# Patient Record
Sex: Male | Born: 2000 | Race: Black or African American | Hispanic: No | Marital: Single | Smoking: Current every day smoker
Health system: Southern US, Community
[De-identification: ages and names within clinical notes are randomized; demographics above are authoritative.]

---
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
## 2020-01-01 ENCOUNTER — Other Ambulatory Visit: Payer: Self-pay

## 2020-01-01 ENCOUNTER — Encounter (HOSPITAL_COMMUNITY): Payer: Self-pay

## 2020-01-01 ENCOUNTER — Ambulatory Visit (HOSPITAL_COMMUNITY)
Admission: EM | Admit: 2020-01-01 | Discharge: 2020-01-01 | Disposition: A | Discharge status: Home / Self Care | Payer: Medicaid Other | Attending: Emergency Medicine | Admitting: Emergency Medicine

## 2020-01-01 DIAGNOSIS — M545 Low back pain, unspecified: Secondary | ICD-10-CM

## 2020-01-01 DIAGNOSIS — M546 Pain in thoracic spine: Secondary | ICD-10-CM

## 2020-01-01 MED ORDER — NAPROXEN 500 MG PO TABS: 500.0000 mg | ORAL_TABLET | Freq: Two times a day (BID) | ORAL | 0 refills | Status: DC

## 2020-01-01 MED ORDER — CYCLOBENZAPRINE HCL 5 MG PO TABS: 5.0000 mg | ORAL_TABLET | Freq: Two times a day (BID) | ORAL | 0 refills | Status: DC | PRN

## 2020-01-01 NOTE — ED Triage Notes (Signed)
Pt presents with bilateral shoulder, bilateral thigh and back pain X 2 weeks not injury related.

## 2020-01-01 NOTE — Discharge Instructions (Signed)
Please use Naprosyn twice daily with food over the next 10 days You may use flexeril as needed to help with pain. This is a muscle relaxer and causes sedation- please use only at bedtime or when you will be home and not have to drive/work Gentle stretching Alternate ice and heat  Follow-up if not improving or worsening

## 2020-01-02 NOTE — ED Provider Notes (Signed)
[facility]    CSN: [id number] Arrival date & time: 01/01/20  1409      History   Chief Complaint Chief Complaint  Patient presents with  . Shoulder Pain  . Back Pain  . Thigh Pain    HPI Alex Gonzales is a 19 y.o. male history of scoliosis presenting today for evaluation of shoulder back and thigh pain.  Patient reports over the past 2 weeks he has had pain in his shoulders and lower back.  Feels soreness in his thighs as well.  Has been using some ibuprofen without relief.  Denies any specific injury or trauma.  Denies significant heavy lifting or repetitive bending.  Denies paresthesias.  Denies issues with bowel/bladder.  Denies chest pain or shortness of breath.  HPI  History reviewed. No pertinent past medical history.  Patient Active Problem List   Diagnosis Date Noted  . Scoliosis 12/08/2015    History reviewed. No pertinent surgical history.     Home Medications    Prior to Admission medications   Medication Sig Start Date End Date Taking? Authorizing Provider  albuterol (PROAIR HFA) 108 (90 Base) MCG/ACT inhaler 2 puffs every 4-6 hours as needed for wheezing/coughing 12/28/19   Lucio Edward, MD  benzonatate (TESSALON) 100 MG capsule Take 1-2 capsules (100-200 mg total) by mouth 3 (three) times daily as needed. 12/01/19   Wallis Bamberg, PA-C  cetirizine (ZYRTEC) 10 MG tablet 1 tab p.o. nightly as needed allergies. 12/28/19   Lucio Edward, MD  cyclobenzaprine (FLEXERIL) 5 MG tablet Take 1-2 tablets (5-10 mg total) by mouth 2 (two) times daily as needed for muscle spasms. 01/01/20   Rafal Archuleta C, PA-C  fluticasone (FLONASE) 50 MCG/ACT nasal spray 1 spray each nostril once a day as needed congestion. 12/28/19   Lucio Edward, MD  naproxen (NAPROSYN) 500 MG tablet Take 1 tablet (500 mg total) by mouth 2 (two) times daily. 01/01/20   Ludmilla Mcgillis, Junius Creamer, PA-C  Spacer/Aero-Holding Chambers (AEROCHAMBER MV) inhaler Use as instructed 12/28/19   Lucio Edward, MD    Family History Family History  Problem Relation Age of Onset  . Asthma Sister   . Asthma Brother     Social History Social History   Tobacco Use  . Smoking status: Never Smoker  . Smokeless tobacco: Never Used  Vaping Use  . Vaping Use: Every day  . Substances: Nicotine  Substance Use Topics  . Alcohol use: No  . Drug use: Yes    Types: Marijuana     Allergies   Patient has no known allergies.   Review of Systems Review of Systems  Constitutional: Negative for fatigue and fever.  Eyes: Negative for redness, itching and visual disturbance.  Respiratory: Negative for shortness of breath.   Cardiovascular: Negative for chest pain and leg swelling.  Gastrointestinal: Negative for nausea and vomiting.  Musculoskeletal: Positive for arthralgias, back pain and myalgias.  Skin: Negative for color change, rash and wound.  Neurological: Negative for dizziness, syncope, weakness, light-headedness and headaches.     Physical Exam Triage Vital Signs ED Triage Vitals  Enc Vitals Group     BP 01/01/20 1427 116/62     Pulse Rate 01/01/20 1427 71     Resp 01/01/20 1427 18     Temp 01/01/20 1427 99 F (37.2 C)     Temp Source 01/01/20 1427 Oral     SpO2 01/01/20 1427 100 %     Weight --      Height --  Head Circumference --      Peak Flow --      Pain Score 01/01/20 1429 7     Pain Loc --      Pain Edu? --      Excl. in [place]? --    No data found.  Updated Vital Signs BP 116/62 (BP Location: Right Arm)   Pulse 71   Temp 99 F (37.2 C) (Oral)   Resp 18   SpO2 100%   Visual Acuity Right Eye Distance:   Left Eye Distance:   Bilateral Distance:    Right Eye Near:   Left Eye Near:    Bilateral Near:     Physical Exam Vitals and nursing note reviewed.  Constitutional:      Appearance: He is well-developed.     Comments: No acute distress  HENT:     Head: Normocephalic and atraumatic.     Nose: Nose normal.  Eyes:      Conjunctiva/sclera: Conjunctivae normal.  Cardiovascular:     Rate and Rhythm: Normal rate.  Pulmonary:     Effort: Pulmonary effort is normal. No respiratory distress.  Abdominal:     General: There is no distension.  Musculoskeletal:        General: Normal range of motion.     Cervical back: Neck supple.     Comments: Tender to palpation diffusely to bilateral trapezius and thoracic areas of back extending into bilateral lumbar areas, tenderness to palpation over anterior thighs  Full active range of motion at shoulders hips and knees  Ambulating without abnormality  Skin:    General: Skin is warm and dry.  Neurological:     Mental Status: He is alert and oriented to person, place, and time.      UC Treatments / Results  Labs (all labs ordered are listed, but only abnormal results are displayed) Labs Reviewed - No data to display  EKG   Radiology No results found.  Procedures Procedures (including critical care time)  Medications Ordered in UC Medications - No data to display  Initial Impression / Assessment and Plan / UC Course  I have reviewed the triage vital signs and the nursing notes.  Pertinent labs & imaging results that were available during my care of the patient were reviewed by me and considered in my medical decision making (see chart for details).     Suspect likely muscular etiology, no mechanism of injury, moving all extremities appropriately, do not suspect acute bony abnormality.  Recommending anti-inflammatories and muscle relaxers with gentle stretching.  Provided Naprosyn as alternative to ibuprofen. Discussed strict return precautions. Patient verbalized understanding and is agreeable with plan.   Final Clinical Impressions(s) / UC Diagnoses   Final diagnoses:  Acute bilateral low back pain without sciatica  Acute bilateral thoracic back pain     Discharge Instructions     Please use Naprosyn twice daily with food over the next 10  days You may use flexeril as needed to help with pain. This is a muscle relaxer and causes sedation- please use only at bedtime or when you will be home and not have to drive/work Gentle stretching Alternate ice and heat  Follow-up if not improving or worsening   ED Prescriptions    Medication Sig Dispense Auth. Provider   naproxen (NAPROSYN) 500 MG tablet Take 1 tablet (500 mg total) by mouth 2 (two) times daily. 30 tablet Grant Henkes C, PA-C   cyclobenzaprine (FLEXERIL) 5 MG tablet Take 1-2 tablets (  5-10 mg total) by mouth 2 (two) times daily as needed for muscle spasms. 24 tablet Gonzales Dacosta, [place] C, PA-C     PDMP not reviewed this encounter.   Lew Dawes, [place] 01/02/20 1342

## 2020-01-03 ENCOUNTER — Encounter: Payer: Self-pay | Admitting: Pediatrics

## 2020-01-03 ENCOUNTER — Ambulatory Visit (INDEPENDENT_AMBULATORY_CARE_PROVIDER_SITE_OTHER): Payer: Medicaid Other | Admitting: Pediatrics

## 2020-01-03 ENCOUNTER — Other Ambulatory Visit: Payer: Self-pay

## 2020-01-03 VITALS — Temp 97.7°F | Wt 122.0 lb

## 2020-01-03 DIAGNOSIS — J020 Streptococcal pharyngitis: Secondary | ICD-10-CM

## 2020-01-03 DIAGNOSIS — G8929 Other chronic pain: Secondary | ICD-10-CM

## 2020-01-03 DIAGNOSIS — J40 Bronchitis, not specified as acute or chronic: Secondary | ICD-10-CM | POA: Diagnosis not present

## 2020-01-03 DIAGNOSIS — M549 Dorsalgia, unspecified: Secondary | ICD-10-CM

## 2020-01-03 DIAGNOSIS — J0100 Acute maxillary sinusitis, unspecified: Secondary | ICD-10-CM

## 2020-01-03 MED ORDER — AMOXICILLIN-POT CLAVULANATE 500-125 MG PO TABS: ORAL_TABLET | ORAL | 0 refills | Status: DC

## 2020-01-03 MED ORDER — PREDNISONE 20 MG PO TABS: ORAL_TABLET | ORAL | 0 refills | Status: DC

## 2020-01-03 NOTE — Patient Instructions (Signed)

## 2020-01-03 NOTE — Progress Notes (Signed)
Subjective:     Patient ID: Alex Gonzales, male   DOB: 07/15/2000, 19 y.o.   MRN: [id number]  Chief Complaint  Patient presents with  . Follow-up    HPI: Patient is here for follow-up visit from last week.  He was diagnosed with pharyngitis as well as bronchitis.  According to the patient, he has been taking his Zithromax.  He states he has been feeling some better, however continues to complain of back pain.  He states he did go to the urgent care due to the back pain and was diagnosed again with muscle spasms and given medications.  He states since he could not go to work, he had to have a doctor's excuse.  Alex Gonzales states that he has used his albuterol.  He states it helps him some.  He states that the last time he took his albuterol inhaler was yesterday.  However, upon further questioning, Alex Gonzales continues to use e-cigarettes.  He states the last time he smoked was yesterday.  Discussed with Alex Gonzales that his strep test was positive.  He states that he continues to have some sore throat as well as postnasal drainage.  He states he has to clear his throat in order to be able to talk.  In regards to back pain, patient states that his upper back as well as lower back has been hurting.  He states sometimes the pain also is anterior thigh as well.  He states that he feels this is secondary to the constant standing that he does at his work.  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  Upon further questioning, he states that sometimes the pain does radiate to the back of the legs as well.  He denies any numbness or tingling.  Noted that Alex Gonzales looked tired today, therefore asked him when did he go to bed last night.  He states at 2:00 in the morning.  I asked him why was it that he did not go to sleep until late, he states he was watching Netflix and got interested in the programs he was watching.  No past  medical history on file.   Family History  Problem Relation Age of Onset  . Asthma Sister   . Asthma Brother     Social History   Tobacco Use  . Smoking status: Current Every Day Smoker    Types: E-cigarettes  . Smokeless tobacco: Never Used  Substance Use Topics  . Alcohol use: No   Social History   Social History Narrative   Lives at home with mother, father and siblings.   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    Outpatient Encounter Medications as of 01/03/2020  Medication Sig  . albuterol (PROAIR HFA) 108 (90 Base) MCG/ACT inhaler 2 puffs every 4-6 hours as needed for wheezing/coughing  . amoxicillin-clavulanate (AUGMENTIN) 500-125 MG tablet 1 tab p.o. twice daily x10 days.  . benzonatate (TESSALON) 100 MG capsule Take 1-2 capsules (100-200 mg total) by mouth 3 (three) times daily as needed.  . cetirizine (ZYRTEC) 10 MG tablet 1 tab p.o. nightly as needed allergies.  . cyclobenzaprine (FLEXERIL) 5 MG tablet Take 1-2 tablets (5-10 mg total) by mouth 2 (two) times daily as needed for muscle spasms.  . fluticasone (FLONASE) 50 MCG/ACT nasal spray 1 spray each nostril once a day as needed congestion.  Marland Kitchen  naproxen (NAPROSYN) 500 MG tablet Take 1 tablet (500 mg total) by mouth 2 (two) times daily.  . predniSONE (DELTASONE) 20 MG tablet 2 tabs p.o. daily x3 days.  Marland Kitchen Spacer/Aero-Holding Chambers (AEROCHAMBER MV) inhaler Use as instructed   No facility-administered encounter medications on file as of 01/03/2020.    Patient has no known allergies.    ROS:  Apart from the symptoms reviewed above, there are no other symptoms referable to all systems reviewed.   Physical Examination   Wt Readings from Last 3 Encounters:  01/03/20 122 lb (55.3 kg) (6 %, Z= -1.58)*  12/28/19 119 lb 12.8 oz (54.3 kg) (4 %, Z= -1.72)*  12/01/19 120 lb (54.4 kg) (5 %, Z= -1.69)*   * Growth percentiles are based on CDC (Boys, 2-20 Years) data.   BP Readings from  Last 3 Encounters:  01/01/20 116/62  12/01/19 127/73  01/30/18 (!) 120/60 (61 %, Z = 0.28 /  23 %, Z = -0.75)*   *BP percentiles are based on the 2017 AAP Clinical Practice Guideline for boys   Body mass index is 18.55 kg/m. 4 %ile (Z= -1.76) based on CDC (Boys, 2-20 Years) BMI-for-age data using weight from 01/03/2020 and height from 12/01/2019. Blood pressure percentiles are not available for patients who are 18 years or older.    General: Alert, NAD,  HEENT: TM's - clear, Throat -improved size of tonsils, however thick postnasal drainage present, Neck - FROM, no meningismus, Sclera - clear LYMPH NODES: No lymphadenopathy noted LUNGS: Clear to auscultation bilaterally,  no wheezing or crackles noted, continued rhonchi with cough CV: RRR without Murmurs ABD: Soft, NT, positive bowel signs,  No hepatosplenomegaly noted GU: Not examined SKIN: Clear, No rashes noted NEUROLOGICAL: Grossly intact MUSCULOSKELETAL: Muscular spasm noted over the right paraspinous muscles, also complains of radiation of pain to the back of the legs.  Tighter hamstrings noted. Psychiatric: Affect normal, non-anxious   Rapid Strep A Screen  Date Value Ref Range Status  12/28/2019 Negative Negative Final     No results found.  Recent Results (from the past 240 hour(s))  Culture, Group A Strep     Status: Abnormal   Collection Time: 12/28/19 11:12 AM   Specimen: Throat  Result Value Ref Range Status   MICRO NUMBER: [id number]  Final   SPECIMEN QUALITY: Adequate  Final   SOURCE: THROAT  Final   STATUS: FINAL  Final   ISOLATE 1: Streptococcus pyogenes (A)  Final    Comment: Group A Streptococcus isolated Beta-hemolytic streptococci are predictably susceptible to Penicillin and other beta-lactams. Susceptibility testing not routinely performed. Please contact the laboratory within 3 days if susceptibility testing is desired.    No results found for this or any previous visit (from the past 48  hour(s)).  Assessment:  1. Acute non-recurrent maxillary sinusitis  2. Strep pharyngitis  3. Other chronic back pain  4. Bronchitis     Plan:   1.  Patient noted to have thick postnasal drainage in the office today.  Also secondary to diagnosis of strep pyogenes infection which is susceptible to penicillins, decided place him on Augmentin 500 mg, 1 tab p.o. twice daily x10 days.  He does have 1 last dosage of Zithromax left for today as well. 2.  Secondary to continued rhonchi noted with the coughing, will place him on prednisone 20 mg, 2 tabs p.o. daily x3 days.  However again discussed at length with Amada Kingfisher that the vaping is exacerbating his bronchitis.  Given that  he states that he was not "hooked" on the vaping, asked him why he has not stopped using this method.  He promises that he will. 3.  Secondary to continuation of back pain and now patient complains of radiation of pain to the back of the legs and has some tight hamstrings that are noted in the examination today, we will have him referred to orthopedics for further evaluation. 4.  Spent 25 minutes with the patient face-to-face of which over 50% was in counseling in regards to evaluation and treatment of sinusitis, bronchitis, vaping as well as back pain. Meds ordered this encounter  Medications  . amoxicillin-clavulanate (AUGMENTIN) 500-125 MG tablet    Sig: 1 tab p.o. twice daily x10 days.    Dispense:  20 tablet    Refill:  0  . predniSONE (DELTASONE) 20 MG tablet    Sig: 2 tabs p.o. daily x3 days.    Dispense:  6 tablet    Refill:  0

## 2020-01-04 ENCOUNTER — Ambulatory Visit: Payer: Self-pay | Admitting: Pediatrics

## 2020-01-06 DIAGNOSIS — Z419 Encounter for procedure for purposes other than remedying health state, unspecified: Secondary | ICD-10-CM | POA: Diagnosis not present

## 2020-01-12 ENCOUNTER — Encounter (HOSPITAL_COMMUNITY): Payer: Self-pay | Admitting: Emergency Medicine

## 2020-01-12 ENCOUNTER — Ambulatory Visit (HOSPITAL_COMMUNITY)
Admission: EM | Admit: 2020-01-12 | Discharge: 2020-01-12 | Disposition: A | Discharge status: Home / Self Care | Payer: Medicaid Other

## 2020-01-12 ENCOUNTER — Other Ambulatory Visit: Payer: Self-pay

## 2020-01-12 DIAGNOSIS — K529 Noninfective gastroenteritis and colitis, unspecified: Secondary | ICD-10-CM | POA: Diagnosis not present

## 2020-01-12 NOTE — Discharge Instructions (Signed)
This is most likely some sort of virus. I am glad you are feeling better. Make sure you drink plenty of fluids and staying hydrated. Bland diet and advance diet as tolerated Follow up as needed for continued or worsening symptoms

## 2020-01-12 NOTE — ED Triage Notes (Signed)
Pt sts vomiting x 2 and diarrhea yesterday that has resolved; pt sts still some GI upset but able to eat and drink today

## 2020-01-12 NOTE — ED Provider Notes (Signed)
[facility]    CSN: [id number] Arrival date & time: 01/12/20  1342      History   Chief Complaint Chief Complaint  Patient presents with  . Emesis    HPI Alex Gonzales is a 19 y.o. male.   Patient is an otherwise healthy 19 year old male presents today with nausea, vomiting and diarrhea that started yesterday evening.  Reporting multiple episodes of vomiting and diarrhea.  Symptoms have since subsided since 3 AM.  Mild abdominal cramping and nausea.  Tried to eat but did not have any appetite.  No fevers, chills, body aches or recent sick contacts.  No respiratory symptoms.  ROS per HPI      History reviewed. No pertinent past medical history.  Patient Active Problem List   Diagnosis Date Noted  . Scoliosis 12/08/2015    History reviewed. No pertinent surgical history.     Home Medications    Prior to Admission medications   Medication Sig Start Date End Date Taking? Authorizing Provider  albuterol (PROAIR HFA) 108 (90 Base) MCG/ACT inhaler 2 puffs every 4-6 hours as needed for wheezing/coughing 12/28/19   Lucio Edward, MD  cetirizine (ZYRTEC) 10 MG tablet 1 tab p.o. nightly as needed allergies. 12/28/19   Lucio Edward, MD  fluticasone (FLONASE) 50 MCG/ACT nasal spray 1 spray each nostril once a day as needed congestion. 12/28/19   Lucio Edward, MD  naproxen (NAPROSYN) 500 MG tablet Take 1 tablet (500 mg total) by mouth 2 (two) times daily. 01/01/20   Wieters, Junius Creamer, PA-C  Spacer/Aero-Holding Chambers (AEROCHAMBER MV) inhaler Use as instructed 12/28/19   Lucio Edward, MD    Family History Family History  Problem Relation Age of Onset  . Asthma Sister   . Asthma Brother     Social History Social History   Tobacco Use  . Smoking status: Current Every Day Smoker    Types: E-cigarettes  . Smokeless tobacco: Never Used  Vaping Use  . Vaping Use: Every day  . Substances: Nicotine  Substance Use Topics  . Alcohol use: No  . Drug  use: Yes    Types: Marijuana     Allergies   Patient has no known allergies.   Review of Systems Review of Systems   Physical Exam Triage Vital Signs ED Triage Vitals  Enc Vitals Group     BP 01/12/20 1513 109/83     Pulse Rate 01/12/20 1513 66     Resp 01/12/20 1513 18     Temp 01/12/20 1513 98.8 F (37.1 C)     Temp Source 01/12/20 1513 Oral     SpO2 01/12/20 1513 100 %     Weight --      Height --      Head Circumference --      Peak Flow --      Pain Score 01/12/20 1514 0     Pain Loc --      Pain Edu? --      Excl. in GC? --    No data found.  Updated Vital Signs BP 109/83 (BP Location: Right Arm)   Pulse 66   Temp 98.8 F (37.1 C) (Oral)   Resp 18   SpO2 100%   Visual Acuity Right Eye Distance:   Left Eye Distance:   Bilateral Distance:    Right Eye Near:   Left Eye Near:    Bilateral Near:     Physical Exam Vitals and nursing note reviewed.  Constitutional:  Appearance: Normal appearance.  HENT:     Head: Normocephalic and atraumatic.     Nose: Nose normal.  Eyes:     Conjunctiva/sclera: Conjunctivae normal.  Cardiovascular:     Rate and Rhythm: Normal rate and regular rhythm.  Pulmonary:     Effort: Pulmonary effort is normal.     Breath sounds: Normal breath sounds.  Abdominal:     Palpations: Abdomen is soft.     Tenderness: There is abdominal tenderness.     Comments: Mildly tender to upper abdominal area  Musculoskeletal:        General: Normal range of motion.     Cervical back: Normal range of motion.  Skin:    General: Skin is warm and dry.  Neurological:     Mental Status: He is alert.  Psychiatric:        Mood and Affect: Mood normal.      UC Treatments / Results  Labs (all labs ordered are listed, but only abnormal results are displayed) Labs Reviewed - No data to display  EKG   Radiology No results found.  Procedures Procedures (including critical care time)  Medications Ordered in UC Medications  - No data to display  Initial Impression / Assessment and Plan / UC Course  I have reviewed the triage vital signs and the nursing notes.  Pertinent labs & imaging results that were available during my care of the patient were reviewed by me and considered in my medical decision making (see chart for details).     Gastroenteritis Recommended drink fluids and stay hydrated.  Bland diet and advance diet as tolerated. Follow up as needed for continued or worsening symptoms  Final Clinical Impressions(s) / UC Diagnoses   Final diagnoses:  Gastroenteritis     Discharge Instructions     This is most likely some sort of virus. I am glad you are feeling better. Make sure you drink plenty of fluids and staying hydrated. Bland diet and advance diet as tolerated Follow up as needed for continued or worsening symptoms     ED Prescriptions    None     PDMP not reviewed this encounter.   Dahlia Byes A, NP 01/12/20 1548

## 2020-01-26 ENCOUNTER — Ambulatory Visit: Payer: Medicaid Other

## 2020-01-28 ENCOUNTER — Other Ambulatory Visit: Payer: Self-pay | Admitting: Pediatrics

## 2020-01-28 DIAGNOSIS — J40 Bronchitis, not specified as acute or chronic: Secondary | ICD-10-CM

## 2020-02-01 ENCOUNTER — Other Ambulatory Visit: Payer: Self-pay | Admitting: Pediatrics

## 2020-02-01 ENCOUNTER — Other Ambulatory Visit: Payer: Self-pay

## 2020-02-01 DIAGNOSIS — J40 Bronchitis, not specified as acute or chronic: Secondary | ICD-10-CM

## 2020-02-04 ENCOUNTER — Ambulatory Visit: Payer: Medicaid Other

## 2020-02-06 DIAGNOSIS — Z419 Encounter for procedure for purposes other than remedying health state, unspecified: Secondary | ICD-10-CM | POA: Diagnosis not present

## 2020-02-16 ENCOUNTER — Ambulatory Visit: Payer: Medicaid Other

## 2020-03-08 DIAGNOSIS — Z419 Encounter for procedure for purposes other than remedying health state, unspecified: Secondary | ICD-10-CM | POA: Diagnosis not present

## 2020-03-20 DIAGNOSIS — Z20822 Contact with and (suspected) exposure to covid-19: Secondary | ICD-10-CM | POA: Diagnosis not present

## 2020-04-07 DIAGNOSIS — Z419 Encounter for procedure for purposes other than remedying health state, unspecified: Secondary | ICD-10-CM | POA: Diagnosis not present

## 2020-04-12 ENCOUNTER — Ambulatory Visit: Payer: Medicaid Other

## 2020-05-01 ENCOUNTER — Other Ambulatory Visit: Payer: Self-pay | Admitting: Pediatrics

## 2020-05-01 ENCOUNTER — Ambulatory Visit: Payer: Medicaid Other | Admitting: Pediatrics

## 2020-05-01 DIAGNOSIS — J40 Bronchitis, not specified as acute or chronic: Secondary | ICD-10-CM

## 2020-05-01 MED ORDER — ALBUTEROL SULFATE HFA 108 (90 BASE) MCG/ACT IN AERS: INHALATION_SPRAY | RESPIRATORY_TRACT | 0 refills | Status: DC

## 2020-05-08 DIAGNOSIS — Z419 Encounter for procedure for purposes other than remedying health state, unspecified: Secondary | ICD-10-CM | POA: Diagnosis not present

## 2020-05-10 ENCOUNTER — Ambulatory Visit: Payer: Medicaid Other | Admitting: Pediatrics

## 2020-06-07 DIAGNOSIS — Z419 Encounter for procedure for purposes other than remedying health state, unspecified: Secondary | ICD-10-CM | POA: Diagnosis not present

## 2020-07-08 DIAGNOSIS — Z419 Encounter for procedure for purposes other than remedying health state, unspecified: Secondary | ICD-10-CM | POA: Diagnosis not present

## 2020-08-08 DIAGNOSIS — Z419 Encounter for procedure for purposes other than remedying health state, unspecified: Secondary | ICD-10-CM | POA: Diagnosis not present

## 2020-08-11 DIAGNOSIS — K130 Diseases of lips: Secondary | ICD-10-CM | POA: Diagnosis not present

## 2020-08-11 DIAGNOSIS — J3489 Other specified disorders of nose and nasal sinuses: Secondary | ICD-10-CM | POA: Diagnosis not present

## 2020-08-22 DIAGNOSIS — G4700 Insomnia, unspecified: Secondary | ICD-10-CM | POA: Diagnosis not present

## 2020-08-22 DIAGNOSIS — R0989 Other specified symptoms and signs involving the circulatory and respiratory systems: Secondary | ICD-10-CM | POA: Diagnosis not present

## 2020-08-22 DIAGNOSIS — R748 Abnormal levels of other serum enzymes: Secondary | ICD-10-CM | POA: Diagnosis not present

## 2020-09-13 ENCOUNTER — Other Ambulatory Visit: Payer: Self-pay | Admitting: Pediatrics

## 2020-09-13 DIAGNOSIS — F411 Generalized anxiety disorder: Secondary | ICD-10-CM | POA: Diagnosis not present

## 2020-09-13 DIAGNOSIS — J40 Bronchitis, not specified as acute or chronic: Secondary | ICD-10-CM

## 2020-09-13 DIAGNOSIS — Z79899 Other long term (current) drug therapy: Secondary | ICD-10-CM | POA: Diagnosis not present

## 2020-09-13 DIAGNOSIS — F331 Major depressive disorder, recurrent, moderate: Secondary | ICD-10-CM | POA: Diagnosis not present

## 2020-09-13 DIAGNOSIS — G4700 Insomnia, unspecified: Secondary | ICD-10-CM | POA: Diagnosis not present

## 2020-09-13 MED ORDER — ALBUTEROL SULFATE HFA 108 (90 BASE) MCG/ACT IN AERS: INHALATION_SPRAY | RESPIRATORY_TRACT | 0 refills | Status: DC

## 2020-09-28 ENCOUNTER — Ambulatory Visit (HOSPITAL_COMMUNITY)
Admission: EM | Admit: 2020-09-28 | Discharge: 2020-09-28 | Disposition: A | Discharge status: Home / Self Care | Payer: Medicaid Other | Attending: Internal Medicine | Admitting: Internal Medicine

## 2020-09-28 ENCOUNTER — Other Ambulatory Visit: Payer: Self-pay

## 2020-09-28 ENCOUNTER — Encounter (HOSPITAL_COMMUNITY): Payer: Self-pay

## 2020-09-28 DIAGNOSIS — S63502A Unspecified sprain of left wrist, initial encounter: Secondary | ICD-10-CM

## 2020-09-28 MED ORDER — IBUPROFEN 400 MG PO TABS
400.0000 mg | ORAL_TABLET | Freq: Four times a day (QID) | ORAL | 0 refills | Status: DC | PRN
Start: 2020-09-28 — End: 2024-02-19

## 2020-09-28 NOTE — ED Triage Notes (Signed)
Pt presents with left wrist pain X 3 days; non injury related.

## 2020-09-28 NOTE — ED Provider Notes (Signed)
[facility]    CSN: [id number] Arrival date & time: 09/28/20  1154      History   Chief Complaint Chief Complaint  Patient presents with  . Wrist Pain    HPI Alex Gonzales is a 20 y.o. male with no past medical history comes to urgent care with left wrist pain of 3 days duration.  Patient did some heavy weightlifting in the gym prior to the onset of the left wrist pain.  No significant trauma noted.  Patient describes the pain as sharp, aggravated by movement and relieved by keeping hand still.  Patient has full range of motion around the left wrist.  Pain is mild to moderate.  No swelling of the wrist.  No bruising.  No numbness or tingling of the left wrist or fingers.Marland Kitchen   HPI  History reviewed. No pertinent past medical history.  Patient Active Problem List   Diagnosis Date Noted  . Scoliosis 12/08/2015    History reviewed. No pertinent surgical history.     Home Medications    Prior to Admission medications   Medication Sig Start Date End Date Taking? Authorizing Provider  ibuprofen (ADVIL) 400 MG tablet Take 1 tablet (400 mg total) by mouth every 6 (six) hours as needed. 09/28/20  Yes Merrilee Jansky, MD  albuterol [facility] HFA) 108 (90 Base) MCG/ACT inhaler 2 PUFFS EVERY 4-6 HOURS AS NEEDED FOR WHEEZING/COUGHING 09/13/20   Richrd Sox, MD  cetirizine (ZYRTEC) 10 MG tablet 1 tab p.o. nightly as needed allergies. 12/28/19   Lucio Edward, MD  fluticasone (FLONASE) 50 MCG/ACT nasal spray 1 spray each nostril once a day as needed congestion. 12/28/19   Lucio Edward, MD  naproxen (NAPROSYN) 500 MG tablet Take 1 tablet (500 mg total) by mouth 2 (two) times daily. 01/01/20   Wieters, Junius Creamer, PA-C  Spacer/Aero-Holding Chambers (AEROCHAMBER MV) inhaler Use as instructed 12/28/19   Lucio Edward, MD    Family History Family History  Problem Relation Age of Onset  . Asthma Sister   . Asthma Brother     Social History Social History   Tobacco Use   . Smoking status: Current Every Day Smoker    Types: E-cigarettes  . Smokeless tobacco: Never Used  Vaping Use  . Vaping Use: Every day  . Substances: Nicotine  Substance Use Topics  . Alcohol use: No  . Drug use: Yes    Types: Marijuana     Allergies   Patient has no known allergies.   Review of Systems Review of Systems  Constitutional: Negative.   Eyes: Negative.   Gastrointestinal: Negative.   Musculoskeletal: Positive for arthralgias. Negative for myalgias.  Neurological: Negative.      Physical Exam Triage Vital Signs ED Triage Vitals  Enc Vitals Group     BP 09/28/20 1251 (!) 123/57     Pulse Rate 09/28/20 1251 75     Resp 09/28/20 1251 17     Temp 09/28/20 1251 97.7 F (36.5 C)     Temp Source 09/28/20 1251 Oral     SpO2 09/28/20 1251 100 %     Weight --      Height --      Head Circumference --      Peak Flow --      Pain Score 09/28/20 1252 6     Pain Loc --      Pain Edu? --      Excl. in GC? --    No data  found.  Updated Vital Signs BP (!) 123/57 (BP Location: Right Arm)   Pulse 75   Temp 97.7 F (36.5 C) (Oral)   Resp 17   SpO2 100%   Visual Acuity Right Eye Distance:   Left Eye Distance:   Bilateral Distance:    Right Eye Near:   Left Eye Near:    Bilateral Near:     Physical Exam Vitals and nursing note reviewed.  Constitutional:      General: He is not in acute distress.    Appearance: He is not ill-appearing.  Cardiovascular:     Rate and Rhythm: Normal rate and regular rhythm.  Musculoskeletal:        General: Tenderness present. No swelling, deformity or signs of injury. Normal range of motion.  Skin:    General: Skin is warm.     Findings: No bruising or erythema.  Neurological:     Mental Status: He is alert.      UC Treatments / Results  Labs (all labs ordered are listed, but only abnormal results are displayed) Labs Reviewed - No data to display  EKG   Radiology No results  found.  Procedures Procedures (including critical care time)  Medications Ordered in UC Medications - No data to display  Initial Impression / Assessment and Plan / UC Course  I have reviewed the triage vital signs and the nursing notes.  Pertinent labs & imaging results that were available during my care of the patient were reviewed by me and considered in my medical decision making (see chart for details).     1.  Left wrist sprain: Left wrist brace Ibuprofen as needed for pain Icing of the left wrist. Gentle range of motion exercises Patient is advised to take some break from weight lifting/vigorous exercise until the left wrist pain subsides. Final Clinical Impressions(s) / UC Diagnoses   Final diagnoses:  Sprain of left wrist, initial encounter     Discharge Instructions     Please ice your wrist Gentle range of motion exercises Take ibuprofen as needed for pain If you have worsening symptoms please return to urgent care to be reevaluated.   ED Prescriptions    Medication Sig Dispense Auth. Provider   ibuprofen (ADVIL) 400 MG tablet Take 1 tablet (400 mg total) by mouth every 6 (six) hours as needed. 30 tablet Ravina Milner, Britta Mccreedy, MD     PDMP not reviewed this encounter.   Merrilee Jansky, MD 09/28/20 [phone]

## 2020-09-28 NOTE — Discharge Instructions (Signed)
Please ice your wrist Gentle range of motion exercises Take ibuprofen as needed for pain If you have worsening symptoms please return to urgent care to be reevaluated.

## 2020-10-26 DIAGNOSIS — E559 Vitamin D deficiency, unspecified: Secondary | ICD-10-CM | POA: Diagnosis not present

## 2020-10-26 DIAGNOSIS — R5383 Other fatigue: Secondary | ICD-10-CM | POA: Diagnosis not present

## 2020-10-26 DIAGNOSIS — F411 Generalized anxiety disorder: Secondary | ICD-10-CM | POA: Diagnosis not present

## 2020-10-26 DIAGNOSIS — F331 Major depressive disorder, recurrent, moderate: Secondary | ICD-10-CM | POA: Diagnosis not present

## 2020-11-05 DIAGNOSIS — Z419 Encounter for procedure for purposes other than remedying health state, unspecified: Secondary | ICD-10-CM | POA: Diagnosis not present

## 2020-12-06 DIAGNOSIS — Z419 Encounter for procedure for purposes other than remedying health state, unspecified: Secondary | ICD-10-CM | POA: Diagnosis not present

## 2021-01-05 DIAGNOSIS — Z419 Encounter for procedure for purposes other than remedying health state, unspecified: Secondary | ICD-10-CM | POA: Diagnosis not present

## 2021-01-12 ENCOUNTER — Other Ambulatory Visit: Payer: Self-pay | Admitting: Pediatrics

## 2021-01-12 DIAGNOSIS — J40 Bronchitis, not specified as acute or chronic: Secondary | ICD-10-CM

## 2021-01-12 NOTE — Telephone Encounter (Signed)
Need a refill 

## 2021-01-15 ENCOUNTER — Encounter: Payer: Self-pay | Admitting: Pediatrics

## 2021-02-05 DIAGNOSIS — Z419 Encounter for procedure for purposes other than remedying health state, unspecified: Secondary | ICD-10-CM | POA: Diagnosis not present

## 2021-03-08 DIAGNOSIS — Z419 Encounter for procedure for purposes other than remedying health state, unspecified: Secondary | ICD-10-CM | POA: Diagnosis not present

## 2021-04-07 DIAGNOSIS — Z419 Encounter for procedure for purposes other than remedying health state, unspecified: Secondary | ICD-10-CM | POA: Diagnosis not present

## 2021-05-08 DIAGNOSIS — Z419 Encounter for procedure for purposes other than remedying health state, unspecified: Secondary | ICD-10-CM | POA: Diagnosis not present

## 2021-06-07 DIAGNOSIS — Z419 Encounter for procedure for purposes other than remedying health state, unspecified: Secondary | ICD-10-CM | POA: Diagnosis not present

## 2021-07-08 DIAGNOSIS — Z419 Encounter for procedure for purposes other than remedying health state, unspecified: Secondary | ICD-10-CM | POA: Diagnosis not present

## 2021-08-08 DIAGNOSIS — Z419 Encounter for procedure for purposes other than remedying health state, unspecified: Secondary | ICD-10-CM | POA: Diagnosis not present

## 2021-09-05 DIAGNOSIS — Z419 Encounter for procedure for purposes other than remedying health state, unspecified: Secondary | ICD-10-CM | POA: Diagnosis not present

## 2021-10-06 DIAGNOSIS — Z419 Encounter for procedure for purposes other than remedying health state, unspecified: Secondary | ICD-10-CM | POA: Diagnosis not present

## 2021-11-05 DIAGNOSIS — Z419 Encounter for procedure for purposes other than remedying health state, unspecified: Secondary | ICD-10-CM | POA: Diagnosis not present

## 2021-12-06 DIAGNOSIS — Z419 Encounter for procedure for purposes other than remedying health state, unspecified: Secondary | ICD-10-CM | POA: Diagnosis not present

## 2022-01-05 DIAGNOSIS — Z419 Encounter for procedure for purposes other than remedying health state, unspecified: Secondary | ICD-10-CM | POA: Diagnosis not present

## 2022-02-05 DIAGNOSIS — Z419 Encounter for procedure for purposes other than remedying health state, unspecified: Secondary | ICD-10-CM | POA: Diagnosis not present

## 2022-03-08 DIAGNOSIS — Z419 Encounter for procedure for purposes other than remedying health state, unspecified: Secondary | ICD-10-CM | POA: Diagnosis not present

## 2022-04-07 DIAGNOSIS — Z419 Encounter for procedure for purposes other than remedying health state, unspecified: Secondary | ICD-10-CM | POA: Diagnosis not present

## 2022-05-08 DIAGNOSIS — Z419 Encounter for procedure for purposes other than remedying health state, unspecified: Secondary | ICD-10-CM | POA: Diagnosis not present

## 2022-06-02 ENCOUNTER — Other Ambulatory Visit: Payer: Self-pay

## 2022-06-02 ENCOUNTER — Emergency Department (HOSPITAL_COMMUNITY): Payer: Medicaid Other

## 2022-06-02 ENCOUNTER — Emergency Department (HOSPITAL_COMMUNITY)
Admission: EM | Admit: 2022-06-02 | Discharge: 2022-06-02 | Disposition: A | Discharge status: Home / Self Care | Payer: Medicaid Other | Attending: Emergency Medicine | Admitting: Emergency Medicine

## 2022-06-02 DIAGNOSIS — Z743 Need for continuous supervision: Secondary | ICD-10-CM | POA: Diagnosis not present

## 2022-06-02 DIAGNOSIS — M67439 Ganglion, unspecified wrist: Secondary | ICD-10-CM | POA: Diagnosis not present

## 2022-06-02 DIAGNOSIS — R451 Restlessness and agitation: Secondary | ICD-10-CM | POA: Diagnosis not present

## 2022-06-02 DIAGNOSIS — F12988 Cannabis use, unspecified with other cannabis-induced disorder: Secondary | ICD-10-CM | POA: Diagnosis not present

## 2022-06-02 DIAGNOSIS — Z79899 Other long term (current) drug therapy: Secondary | ICD-10-CM | POA: Diagnosis not present

## 2022-06-02 DIAGNOSIS — R112 Nausea with vomiting, unspecified: Secondary | ICD-10-CM

## 2022-06-02 DIAGNOSIS — R1030 Lower abdominal pain, unspecified: Secondary | ICD-10-CM

## 2022-06-02 DIAGNOSIS — I959 Hypotension, unspecified: Secondary | ICD-10-CM | POA: Diagnosis not present

## 2022-06-02 DIAGNOSIS — R1084 Generalized abdominal pain: Secondary | ICD-10-CM | POA: Diagnosis not present

## 2022-06-02 LAB — URINALYSIS, ROUTINE W REFLEX MICROSCOPIC
Bilirubin Urine: NEGATIVE
Glucose, UA: NEGATIVE mg/dL
Hgb urine dipstick: NEGATIVE
Ketones, ur: >80 mg/dL — AB
Leukocytes,Ua: NEGATIVE
Nitrite: NEGATIVE
Protein, ur: 30 mg/dL — AB
Specific Gravity, Urine: >1.03 — ABNORMAL HIGH (ref 1.005–1.030)
pH: 6 (ref 5.0–8.0)

## 2022-06-02 LAB — RAPID URINE DRUG SCREEN, HOSP PERFORMED
Amphetamines: NOT DETECTED
Barbiturates: NOT DETECTED
Benzodiazepines: NOT DETECTED
Cocaine: NOT DETECTED
Opiates: NOT DETECTED
Tetrahydrocannabinol: POSITIVE — AB

## 2022-06-02 LAB — I-STAT CHEM 8, ED
BUN: 13 mg/dL (ref 6–20)
Calcium, Ion: 0.93 mmol/L — ABNORMAL LOW (ref 1.15–1.40)
Chloride: 108 mmol/L (ref 98–111)
Creatinine, Ser: 0.6 mg/dL — ABNORMAL LOW (ref 0.61–1.24)
Glucose, Bld: 66 mg/dL — ABNORMAL LOW (ref 70–99)
HCT: 41 % (ref 39.0–52.0)
Hemoglobin: 13.9 g/dL (ref 13.0–17.0)
Potassium: 3.2 mmol/L — ABNORMAL LOW (ref 3.5–5.1)
Sodium: 141 mmol/L (ref 135–145)
TCO2: 18 mmol/L — ABNORMAL LOW (ref 22–32)

## 2022-06-02 LAB — URINALYSIS, MICROSCOPIC (REFLEX)
RBC / HPF: NONE SEEN RBC/hpf (ref 0–5)
Squamous Epithelial / HPF: NONE SEEN (ref 0–5)

## 2022-06-02 LAB — COMPREHENSIVE METABOLIC PANEL
ALT: 22 U/L (ref 0–44)
AST: 32 U/L (ref 15–41)
Albumin: 4.1 g/dL (ref 3.5–5.0)
Alkaline Phosphatase: 74 U/L (ref 38–126)
Anion gap: 18 — ABNORMAL HIGH (ref 5–15)
BUN: 14 mg/dL (ref 6–20)
CO2: 17 mmol/L — ABNORMAL LOW (ref 22–32)
Calcium: 8.3 mg/dL — ABNORMAL LOW (ref 8.9–10.3)
Chloride: 107 mmol/L (ref 98–111)
Creatinine, Ser: 0.72 mg/dL (ref 0.61–1.24)
GFR, Estimated: >60 mL/min (ref >60)
Glucose, Bld: 66 mg/dL — ABNORMAL LOW (ref 70–99)
Potassium: 3.1 mmol/L — ABNORMAL LOW (ref 3.5–5.1)
Sodium: 142 mmol/L (ref 135–145)
Total Bilirubin: 0.8 mg/dL (ref 0.3–1.2)
Total Protein: 6.9 g/dL (ref 6.5–8.1)

## 2022-06-02 LAB — ETHANOL: Alcohol, Ethyl (B): 34 mg/dL — ABNORMAL HIGH (ref <10)

## 2022-06-02 LAB — CBC
HCT: 48.5 % (ref 39.0–52.0)
Hemoglobin: 15.3 g/dL (ref 13.0–17.0)
MCH: 31.5 pg (ref 26.0–34.0)
MCHC: 31.5 g/dL (ref 30.0–36.0)
MCV: 99.8 fL (ref 80.0–100.0)
Platelets: 261 10*3/uL (ref 150–400)
RBC: 4.86 MIL/uL (ref 4.22–5.81)
RDW: 12.7 % (ref 11.5–15.5)
WBC: 13.3 10*3/uL — ABNORMAL HIGH (ref 4.0–10.5)
nRBC: 0 % (ref 0.0–0.2)

## 2022-06-02 LAB — LIPASE, BLOOD: Lipase: 26 U/L (ref 11–51)

## 2022-06-02 MED ORDER — LORAZEPAM 2 MG/ML IJ SOLN
1.0000 mg | Freq: Once | INTRAMUSCULAR | Status: AC
  Administered 2022-06-02: 1 mg via INTRAVENOUS
  Filled 2022-06-02: qty 1

## 2022-06-02 MED ORDER — ONDANSETRON 4 MG PO TBDP: 4.0000 mg | ORAL_TABLET | ORAL | 0 refills | Status: DC | PRN

## 2022-06-02 MED ORDER — LACTATED RINGERS IV BOLUS
1000.0000 mL | Freq: Once | INTRAVENOUS | Status: AC
  Administered 2022-06-02: 1000 mL via INTRAVENOUS

## 2022-06-02 MED ORDER — FAMOTIDINE IN NACL 20-0.9 MG/50ML-% IV SOLN: 20.0000 mg | Freq: Once | INTRAVENOUS | Status: DC

## 2022-06-02 MED ORDER — CAPSAICIN 0.075 % EX STCK: 1.0000 | Freq: Three times a day (TID) | CUTANEOUS | 2 refills | Status: DC | PRN

## 2022-06-02 MED ORDER — IOHEXOL 300 MG/ML  SOLN
100.0000 mL | Freq: Once | INTRAMUSCULAR | Status: AC | PRN
  Administered 2022-06-02: 100 mL via INTRAVENOUS

## 2022-06-02 NOTE — ED Provider Notes (Signed)
[facility] DEPT Provider Note   CSN: [id number] Arrival date & time: 06/02/22  [id number]     History  Chief Complaint  Patient presents with   Abdominal Pain   Emesis   Nausea    Alex Gonzales is a 21 y.o. male.  HPI Patient denies any past medical history.  Alex Gonzales reports Alex Gonzales was a little uncomfortable with abdominal pain yesterday evening around bedtime.  Alex Gonzales reports Alex Gonzales did not eat dinner.  About 2 hours prior to arrival Alex Gonzales started getting severe abdominal pain.  Sharp and cramping.  Fairly diffuse through the abdomen.  So severe Alex Gonzales is lying on the floor in fetal position.  Patient denies diarrhea or vomiting.  No fever.  Alex Gonzales did drink 2 shots of alcohol yesterday and smoked some marijuana.  Alex Gonzales reports Alex Gonzales does have some frequency of marijuana use.  Denies ever having a similar episode of abdominal pain or vomiting.  Denies any associated testicular pain or urinary symptoms.                                 Home Medications Prior to Admission medications   Medication Sig Start Date End Date Taking? Authorizing Provider  Capsaicin 0.075 % STCK Apply 1 Application topically every 8 (eight) hours as needed. 06/02/22  Yes Charlesetta Shanks, MD  ondansetron (ZOFRAN-ODT) 4 MG disintegrating tablet Take 1 tablet (4 mg total) by mouth every 4 (four) hours as needed for nausea or vomiting. 06/02/22  Yes Charlesetta Shanks, MD  albuterol (PROAIR HFA) 108 (90 Base) MCG/ACT inhaler 2 PUFFS EVERY 4-6 HOURS AS NEEDED FOR WHEEZING/COUGHING 09/13/20   Kyra Leyland, MD  cetirizine (ZYRTEC) 10 MG tablet 1 tab p.o. nightly as needed allergies. 12/28/19   Saddie Benders, MD  fluticasone (FLONASE) 50 MCG/ACT nasal spray 1 spray each nostril once a day as needed congestion. 12/28/19   Saddie Benders, MD  ibuprofen (ADVIL) 400 MG tablet Take 1 tablet (400 mg total) by mouth every 6 (six) hours as needed. 09/28/20   Lamptey, Myrene Galas, MD  naproxen (NAPROSYN) 500 MG tablet Take 1 tablet (500  mg total) by mouth 2 (two) times daily. 01/01/20   Wieters, Elesa Hacker, PA-C  Spacer/Aero-Holding Chambers (AEROCHAMBER MV) inhaler Use as instructed 12/28/19   Saddie Benders, MD      Allergies    Patient has no known allergies.    Review of Systems   Review of Systems  Physical Exam Updated Vital Signs BP 122/68   Pulse 82   Temp 98.5 F (36.9 C) (Oral)   Resp 17   SpO2 98%  Physical Exam Constitutional:      Comments: Alert and nontoxic.  Uncomfortable in appearance.  Lying on his side with hands on abdomen.  No respiratory distress.  HENT:     Head: Normocephalic and atraumatic.     Mouth/Throat:     Pharynx: Oropharynx is clear.  Eyes:     Extraocular Movements: Extraocular movements intact.  Cardiovascular:     Rate and Rhythm: Normal rate and regular rhythm.  Pulmonary:     Effort: Pulmonary effort is normal.     Breath sounds: Normal breath sounds.  Abdominal:     Comments: Abdomen soft without guarding.  Diffusely tender with some concentration to the right side and lower abdomen.  Musculoskeletal:        General: No swelling or tenderness. Normal range of motion.  Right lower leg: No edema.     Left lower leg: No edema.  Skin:    General: Skin is warm and dry.  Neurological:     General: No focal deficit present.     Mental Status: Alex Gonzales is oriented to person, place, and time.     Coordination: Coordination normal.     ED Results / Procedures / Treatments   Labs (all labs ordered are listed, but only abnormal results are displayed) Labs Reviewed  CBC - Abnormal; Notable for the following components:      Result Value   WBC 13.3 (*)    All other components within normal limits  URINALYSIS, ROUTINE W REFLEX MICROSCOPIC - Abnormal; Notable for the following components:   Specific Gravity, Urine >1.030 (*)    Ketones, ur >80 (*)    Protein, ur 30 (*)    All other components within normal limits  RAPID URINE DRUG SCREEN, HOSP PERFORMED - Abnormal; Notable  for the following components:   Tetrahydrocannabinol POSITIVE (*)    All other components within normal limits  ETHANOL - Abnormal; Notable for the following components:   Alcohol, Ethyl (B) 34 (*)    All other components within normal limits  COMPREHENSIVE METABOLIC PANEL - Abnormal; Notable for the following components:   Potassium 3.1 (*)    CO2 17 (*)    Glucose, Bld 66 (*)    Calcium 8.3 (*)    Anion gap 18 (*)    All other components within normal limits  URINALYSIS, MICROSCOPIC (REFLEX) - Abnormal; Notable for the following components:   Bacteria, UA RARE (*)    All other components within normal limits  I-STAT CHEM 8, ED - Abnormal; Notable for the following components:   Potassium 3.2 (*)    Creatinine, Ser 0.60 (*)    Glucose, Bld 66 (*)    Calcium, Ion 0.93 (*)    TCO2 18 (*)    All other components within normal limits  LIPASE, BLOOD    EKG None  Radiology CT Abdomen Pelvis W Contrast  Result Date: 06/02/2022 CLINICAL DATA:  Abdominal pain, acute, nonlocalized EXAM: CT ABDOMEN AND PELVIS WITH CONTRAST TECHNIQUE: Multidetector CT imaging of the abdomen and pelvis was performed using the standard protocol following bolus administration of intravenous contrast. RADIATION DOSE REDUCTION: This exam was performed according to the departmental dose-optimization program which includes automated exposure control, adjustment of the mA and/or kV according to patient size and/or use of iterative reconstruction technique. CONTRAST:  100 mL Omnipaque 300non-ionic IV contrast COMPARISON:  None Available. FINDINGS: Lower chest: No acute abnormality. Hepatobiliary: No focal liver abnormality is seen. No gallstones, gallbladder wall thickening, or biliary dilatation. Pancreas: Unremarkable. No pancreatic ductal dilatation or surrounding inflammatory changes. Spleen: Normal in size without focal abnormality. Adrenals/Urinary Tract: Adrenal glands are unremarkable. Kidneys are normal,  without renal calculi, focal lesion, or hydronephrosis. Bladder is unremarkable. Stomach/Bowel: Stomach is within normal limits. Appendix is not seen discretely and there is no evidence of appendicitis. No evidence of bowel wall thickening, distention, or inflammatory changes. Vascular/Lymphatic: No significant vascular findings are present. No enlarged abdominal or pelvic lymph nodes. Reproductive: The prostate is unremarkable. Other: No abdominal wall hernia or abnormality. No abdominopelvic ascites. Musculoskeletal: No acute or significant osseous findings. IMPRESSION: No acute abdominal or pelvic pathology identified. Electronically Signed   By: Layla Maw M.D.   On: 06/02/2022 12:24    Procedures Procedures    Medications Ordered in ED Medications  lactated ringers  bolus 1,000 mL (0 mLs Intravenous Stopped 06/02/22 1017)  LORazepam (ATIVAN) injection 1 mg (1 mg Intravenous Given 06/02/22 0747)  iohexol (OMNIPAQUE) 300 MG/ML solution 100 mL (100 mLs Intravenous Contrast Given 06/02/22 1203)    ED Course/ Medical Decision Making/ A&P                           Medical Decision Making Amount and/or Complexity of Data Reviewed Labs: ordered. Radiology: ordered.  Risk OTC drugs. Prescription drug management.   Patient presents a disproportionate amount of abdominal pain.  Central and right lower abdomen.  Alex Gonzales describes a's increasing abruptly and pain level over the past several hours.  Alex Gonzales does seem slightly vague and distracted but is giving history.  Alex Gonzales does endorse some alcohol and marijuana use prior to the onset of symptoms.  Alex Gonzales however denies pain episodes similar to this.  Differential diagnosis includes intra-abdominal pathology such as appendicitis\bowel obstruction\kidney stone or urinary tract infection\cannabinoid hyperemesis.\  We will proceed with diagnostic evaluation and symptomatic treatment.  Patient is rehydrated and given 1 mg of Ativan for nausea and agitation  suspected secondary to cannabinoid hyperemesis.  09: 00 patient rechecked.  Alex Gonzales feels much better after 1 mg of Ativan.  Alex Gonzales is resting quietly without further severe pain or agitation.  Patient is father advised there is additional diagnostic evaluation needed and they are still waiting for CT scan.  9: 48 CMP not showing a process.  CT has not been done.  Following up regarding delay in processing of these orders. 10: 76 CMP had to be redrawn for lab. For CT.  Several rechecks and the patient is improving significantly.  CT scan does not show acute findings.  I have educated the patient and his father who is at bedside about symptoms of cannabinol and hyperemesis.  They have also been given instructions for abdominal pain and managing nausea at home.  Patient highly encouraged to discontinue marijuana at this time given severity of initial symptoms.  Return precautions reviewed.  At discharge patient pointed out a ganglion cyst on his wrist.  I have counseled the patient on absolutely not trying to punctured or draining it on his own.  Alex Gonzales is aware that this must have follow-up with hand specialist if Alex Gonzales wishes to get it definitively treated.        Final Clinical Impression(s) / ED Diagnoses Final diagnoses:  Lower abdominal pain  Nausea and vomiting, unspecified vomiting type  Cannabinoid hyperemesis syndrome  Ganglion cyst of volar aspect of wrist    Rx / DC Orders ED Discharge Orders          Ordered    Capsaicin 0.075 % STCK  Every 8 hours PRN        06/02/22 1404    ondansetron (ZOFRAN-ODT) 4 MG disintegrating tablet  Every 4 hours PRN        06/02/22 1404              Charlesetta Shanks, MD 06/03/22 1812

## 2022-06-02 NOTE — ED Notes (Signed)
Pt transported to CT ?

## 2022-06-02 NOTE — ED Notes (Signed)
Pt is aware that a urine sample is needed.  

## 2022-06-02 NOTE — ED Triage Notes (Signed)
Pt to ED via EMS from home. EMS reports that pt started having lower abd pain 2 hours ago with N/V. States pt was rolling around screaming on the floor when they got to his home.  EMS reports  BP 112/68 HR 92 O2 99% RA

## 2022-06-02 NOTE — Discharge Instructions (Signed)
1.  Your symptoms of nominal pain and vomiting appear very likely due to marijuana use.  People who have had longer term use and more frequent use more often get a syndrome called cannabinol hyperemesis syndrome.  This is episodes of severe abdominal pain and vomiting associated with marijuana use.  Your discharge instructions includes additional information about this.  Also, people can experience episodes of agitation and confusion and even psychosis with marijuana use.  Sometimes this is a marijuana and sometimes something else has been added to it unbeknownst to the user.  To avoid these episodes, you need to completely discontinue your use of marijuana. 2.  Information for abdominal pain has been included.  Return if you get a fever, suddenly changing or worsening pain or other concerning symptoms. 3.  To help with symptoms of abdominal pain from marijuana, people can use an over-the-counter muscle cream called capsaicin cream.  This creates a hot sensation and helps with the abdominal pain.  Been given a prescription for Zofran to take for nausea.  Drink a lot of fluids over the next 2 days.  Hydrate well.  Avoid all alcohol or any drug use. 4.  You pointed out a cyst on your wrist.  This appears to be a ganglion cyst.  Do not try to puncture it, cut it or do anything to it.  There are many blood vessels and nerves in this area that you could damage.  To have this taken care of, you must see an orthopedic specialist who treats hand and wrist problems.  You have been given the contact information for Dr. Orlan Leavens.  He has a hand specialist at [facility].

## 2023-03-20 ENCOUNTER — Encounter: Payer: Self-pay | Admitting: *Deleted

## 2024-02-19 ENCOUNTER — Encounter (HOSPITAL_COMMUNITY): Payer: Self-pay

## 2024-02-19 ENCOUNTER — Emergency Department (HOSPITAL_COMMUNITY)
Admission: EM | Admit: 2024-02-19 | Discharge: 2024-02-19 | Discharge status: Against Medical Advice | Attending: Emergency Medicine | Admitting: Emergency Medicine

## 2024-02-19 ENCOUNTER — Ambulatory Visit (HOSPITAL_COMMUNITY)
Admission: EM | Admit: 2024-02-19 | Discharge: 2024-02-19 | Disposition: A | Discharge status: Home / Self Care | Attending: Internal Medicine | Admitting: Internal Medicine

## 2024-02-19 ENCOUNTER — Other Ambulatory Visit: Payer: Self-pay

## 2024-02-19 DIAGNOSIS — S0502XA Injury of conjunctiva and corneal abrasion without foreign body, left eye, initial encounter: Secondary | ICD-10-CM | POA: Diagnosis not present

## 2024-02-19 DIAGNOSIS — H5789 Other specified disorders of eye and adnexa: Secondary | ICD-10-CM | POA: Diagnosis present

## 2024-02-19 DIAGNOSIS — Z5321 Procedure and treatment not carried out due to patient leaving prior to being seen by health care provider: Secondary | ICD-10-CM | POA: Insufficient documentation

## 2024-02-19 MED ORDER — POLYMYXIN B-TRIMETHOPRIM 10000-0.1 UNIT/ML-% OP SOLN: 1.0000 [drp] | Freq: Three times a day (TID) | OPHTHALMIC | 0 refills | Status: AC

## 2024-02-19 MED ORDER — FLUORESCEIN SODIUM 1 MG OP STRP
ORAL_STRIP | OPHTHALMIC | Status: AC
  Filled 2024-02-19: qty 1

## 2024-02-19 NOTE — ED Triage Notes (Signed)
 Chief Complaint: right eye pain, irritation, and redness. Denies any vision changes.   Sick exposure: No  Onset: 4 days  Prescriptions or OTC medications tried: Yes- otc eye drops, eye flush    with little relief  New foods, medications, or products: No  Recent Travel: No

## 2024-02-19 NOTE — ED Provider Triage Note (Signed)
 Emergency Medicine Provider Triage Evaluation Note  Alex Gonzales , a 23 y.o. male  was evaluated in triage.  Pt complains of left eye irritation.  Patient states that he had an itchy eye develop a few days ago and they rubbed it for relief.  Now they have pain and burning in the left eye.  They deny vision changes at this time.  Review of Systems  Positive:  Negative:   Physical Exam  BP 128/77 (BP Location: Right Arm)   Pulse 75   Temp 98.6 F (37 C) (Oral)   Resp 18   Ht 5' 8 (1.727 m)   Wt 88.5 kg   SpO2 100%   BMI 29.65 kg/m  Gen:   Awake, no distress   Resp:  Normal effort  MSK:   Moves extremities without difficulty  Other:    Medical Decision Making  Medically screening exam initiated at 12:17 AM.  Appropriate orders placed.  Alex Gonzales was informed that the remainder of the evaluation will be completed by another provider, this initial triage assessment does not replace that evaluation, and the importance of remaining in the ED until their evaluation is complete.     Alex Ubaldo NOVAK, PA-C 02/19/24 [phone]

## 2024-02-19 NOTE — ED Triage Notes (Signed)
 Patient bib EMS with complaints that 3 days ago he started having left eye pain. He has tried flushing it with water and eye drops with no relief.    9/10 pain when open 7/10 when closed  Both eyes are red   EMS Vitals  140/90 80 97%

## 2024-02-19 NOTE — Discharge Instructions (Addendum)
 If you get worse in the next 24 hours, please go see the eye doctor right away and tell them you may have Iritis

## 2024-02-19 NOTE — ED Provider Notes (Signed)
 [facility]    CSN: [id number] Arrival date & time: 02/19/24  9173      History   Chief Complaint Chief Complaint  Patient presents with   Eye Problem    HPI Alex Gonzales is a 23 y.o. male who presents with R eye pain, redness and irritation x 4 days. Has been using OTC saline drops but is not helping. Denies any drainage or vision changes. Admits of having a sensation he has something in it. Has been irrigating it but does not help. Has photophobia with bright light which makes his eye tear up.     History reviewed. No pertinent past medical history.  Patient Active Problem List   Diagnosis Date Noted   Scoliosis 12/08/2015    History reviewed. No pertinent surgical history.     Home Medications    Prior to Admission medications   Medication Sig Start Date End Date Taking? Authorizing Provider  trimethoprim -polymyxin b  (POLYTRIM ) ophthalmic solution Place 1 drop into the left eye in the morning, at noon, and at bedtime. 02/19/24  Yes Rodriguez-Southworth, Kyra, PA-C    Family History Family History  Problem Relation Age of Onset   Asthma Sister    Asthma Brother     Social History Social History   Tobacco Use   Smoking status: Every Day    Types: E-cigarettes   Smokeless tobacco: Never  Vaping Use   Vaping status: Some Days   Substances: Nicotine, Flavoring  Substance Use Topics   Alcohol use: Not Currently   Drug use: Not Currently    Types: Marijuana     Allergies   Patient has no known allergies.   Review of Systems Review of Systems As noted in HPI  Physical Exam Triage Vital Signs ED Triage Vitals  Encounter Vitals Group     BP 02/19/24 0857 119/74     Girls Systolic BP Percentile --      Girls Diastolic BP Percentile --      Boys Systolic BP Percentile --      Boys Diastolic BP Percentile --      Pulse Rate 02/19/24 0857 66     Resp 02/19/24 0857 18     Temp 02/19/24 0857 98.1 F (36.7 C)     Temp Source  02/19/24 0857 Oral     SpO2 02/19/24 0857 98 %     Weight --      Height 02/19/24 0856 5' 8 (1.727 m)     Head Circumference --      Peak Flow --      Pain Score 02/19/24 0855 7     Pain Loc --      Pain Education --      Exclude from Growth Chart --    No data found.  Updated Vital Signs BP 119/74 (BP Location: Left Arm)   Pulse 66   Temp 98.1 F (36.7 C) (Oral)   Resp 18   Ht 5' 8 (1.727 m)   SpO2 98%   BMI 29.65 kg/m   Visual Acuity Right Eye Distance:   Left Eye Distance:   Bilateral Distance:    Right Eye Near:   Left Eye Near:    Bilateral Near:     Physical Exam Vitals and nursing note reviewed.  Constitutional:      General: He is not in acute distress.    Appearance: He is obese. He is not toxic-appearing.  HENT:     Right Ear: External ear normal.  Left Ear: External ear normal.  Eyes:     General: Lids are normal. Lids are everted, no foreign bodies appreciated. No scleral icterus.       Left eye: No foreign body or hordeolum.     Extraocular Movements: Extraocular movements intact.     Pupils: Pupils are equal, round, and reactive to light.     Left eye: Corneal abrasion and fluorescein  uptake present.      Comments: Left medial sclera in very injected and the lower and L only mild. There is no perilimbal erythema. There is a faint linear corneal abrasion around 6 o'clock.   Musculoskeletal:     Cervical back: Neck supple.  Skin:    General: Skin is warm and dry.     Findings: No rash.  Neurological:     Mental Status: He is alert and oriented to person, place, and time.     Gait: Gait normal.  Psychiatric:        Mood and Affect: Mood normal.        Behavior: Behavior normal.        Thought Content: Thought content normal.        Judgment: Judgment normal.      UC Treatments / Results  Labs (all labs ordered are listed, but only abnormal results are displayed) Labs Reviewed - No data to display  EKG   Radiology No results  found.  Procedures Procedures (including critical care time)  Medications Ordered in UC Medications - No data to display  Initial Impression / Assessment and Plan / UC Course  I have reviewed the triage vital signs and the nursing notes.  Left corneal abrasion  I placed him on Polytrim  eye gtts as noted. I educated him about iritis and if he gets worse in the next 24 hours, needs to FU with eye doctor to r/o this out.   Final Clinical Impressions(s) / UC Diagnoses   Final diagnoses:  Abrasion of left cornea, initial encounter     Discharge Instructions      If you get worse in the next 24 hours, please go see the eye doctor right away and tell them you may have Iritis     ED Prescriptions     Medication Sig Dispense Auth. Provider   trimethoprim -polymyxin b  (POLYTRIM ) ophthalmic solution Place 1 drop into the left eye in the morning, at noon, and at bedtime. 10 mL Rodriguez-Southworth, Kyra, PA-C      PDMP not reviewed this encounter.   Lindi Kyra, [place] 02/19/24 [phone]

## 2024-03-26 ENCOUNTER — Encounter: Payer: Self-pay | Admitting: *Deleted

## 2024-04-05 ENCOUNTER — Encounter
